# Patient Record
Sex: Female | Born: 1985 | Race: Black or African American | Hispanic: No | Marital: Married
Health system: Southern US, Community
[De-identification: ages and names within clinical notes are randomized; demographics above are authoritative.]

---

## 2004-08-31 ENCOUNTER — Emergency Department: Payer: Self-pay | Admitting: Emergency Medicine

## 2005-05-23 ENCOUNTER — Emergency Department: Payer: Self-pay | Admitting: Emergency Medicine

## 2007-02-02 ENCOUNTER — Emergency Department: Payer: Self-pay | Admitting: Emergency Medicine

## 2009-01-15 ENCOUNTER — Emergency Department: Payer: Self-pay | Admitting: Unknown Physician Specialty

## 2010-02-11 ENCOUNTER — Emergency Department: Payer: Self-pay | Admitting: Emergency Medicine

## 2012-02-13 ENCOUNTER — Ambulatory Visit: Payer: Self-pay | Admitting: Family Medicine

## 2012-02-16 LAB — BETA STREP CULTURE(ARMC)

## 2012-03-10 ENCOUNTER — Ambulatory Visit: Payer: Self-pay

## 2012-06-20 ENCOUNTER — Ambulatory Visit: Payer: Self-pay | Admitting: Internal Medicine

## 2012-08-06 ENCOUNTER — Ambulatory Visit: Payer: Self-pay | Admitting: Surgery

## 2012-08-13 ENCOUNTER — Ambulatory Visit: Payer: Self-pay | Admitting: Surgery

## 2012-08-13 LAB — CBC WITH DIFFERENTIAL/PLATELET
Basophil #: 0 10*3/uL (ref 0.0–0.1)
Basophil %: 0.4 %
Eosinophil #: 0.1 10*3/uL (ref 0.0–0.7)
Eosinophil %: 2.5 %
Lymphocyte #: 1.6 10*3/uL (ref 1.0–3.6)
Lymphocyte %: 30.6 %
MCH: 28.8 pg (ref 26.0–34.0)
MCV: 87 fL (ref 80–100)
Monocyte %: 4.5 %
Platelet: 339 10*3/uL (ref 150–440)
RBC: 4.8 10*6/uL (ref 3.80–5.20)
RDW: 13.7 % (ref 11.5–14.5)
WBC: 5.1 10*3/uL (ref 3.6–11.0)

## 2012-08-13 LAB — HEPATIC FUNCTION PANEL A (ARMC)
Bilirubin, Direct: <0.05 mg/dL (ref 0.00–0.20)
Total Protein: 8.1 g/dL (ref 6.4–8.2)

## 2012-08-26 ENCOUNTER — Ambulatory Visit: Payer: Self-pay | Admitting: Surgery

## 2013-07-22 ENCOUNTER — Emergency Department: Payer: Self-pay | Admitting: Internal Medicine

## 2013-07-27 ENCOUNTER — Emergency Department: Payer: Self-pay | Admitting: Emergency Medicine

## 2013-07-27 LAB — URINALYSIS, COMPLETE
BILIRUBIN, UR: NEGATIVE
BLOOD: NEGATIVE
Bacteria: NONE SEEN
Glucose,UR: NEGATIVE mg/dL (ref 0–75)
Ketone: NEGATIVE
Leukocyte Esterase: NEGATIVE
NITRITE: NEGATIVE
PH: 7 (ref 4.5–8.0)
Protein: NEGATIVE
RBC, UR: NONE SEEN /HPF (ref 0–5)
Specific Gravity: 1.019 (ref 1.003–1.030)
WBC UR: <1 /HPF (ref 0–5)

## 2014-06-25 NOTE — Op Note (Signed)
PATIENT NAME:  Krista Deleon, [name] MR#:  [id number] DATE OF BIRTH:  1986-02-17  DATE OF PROCEDURE:  08/06/2012  PREOPERATIVE DIAGNOSES: Cholecystitis and cholelithiasis.   POSTOPERATIVE DIAGNOSIS: Cholecystitis and cholelithiasis.   OPERATION: Robotically-assisted laparoscopic cholecystectomy.   SURGEON: Dr. Michela PitcherEly.   ANESTHESIA: General.   OPERATIVE PROCEDURE: With the patient in the supine position after the induction of appropriate general anesthesia, the patient's abdomen was prepped with ChloraPrep and draped with sterile towels. The patient was placed in the head-down, feet-up position. A small infraumbilical incision was made in the standard fashion, carried down bluntly through the subcutaneous tissue. A Veress needle was used to cannulate the peritoneal cavity. CO2 was insufflated to appropriate pressure measurements. When approximately 2.5 liters of CO2 were instilled the Veress needle was withdrawn. An 11 mm Ethicon bariatric port was inserted into the peritoneal cavity and intraperitoneal position was confirmed. CO2 was reinsufflated. Three 8.5 mm ports were placed in the standard fashion. Intraperitoneal position was confirmed. The robot was then brought to the table and docked to the ports without difficulty. Instruments were placed under direct vision and moved to the vicinity of the gallbladder, all under direct vision. The ports were locked in place. I then moved to the surgical console. The gallbladder was elevated and retracted superiorly and laterally. The hepatoduodenal ligament was looked at. The cystic duct and cystic artery were identified. Dissection was carried in the ligament without difficulty. The common duct was identified. The cystic duct was doubly-clipped and divided. The cystic artery was doubly-clipped and divided. The gallbladder was then dissected free from its bed in the liver  using the cauterized scissors. Dissection was without difficulty. However, a small rent was made in  the body of the gallbladder and that area was controlled with a clamp. Once the gallbladder was free the area was copiously irrigated with warm saline solution to dilute any bile that might have been spilled. No stones were lost. The robot was undocked, the camera moved to the lateral port. The gallbladder was captured in an EndoCatch apparatus, brought out through the umbilical port. The area was again copiously suctioned and irrigated. All ports were withdrawn under direct vision. The abdomen was desufflated. Skin incisions were closed with 5-0 nylon. Quarter-percent Marcaine was injected in the skin for postoperative pain control. Sterile dressings were applied. The patient was returned to the recovery room having tolerated the procedure well. Sponge, instrument, and needle counts were correct x 2 in the operating room.     ____________________________ Quentin Orealph L. Ely III, MD rle:dm D: 08/06/2012 09:50:54 ET T: 08/06/2012 10:25:32 ET JOB#: [id number]  cc: Carmie Endalph L. Ely III, MD, <Dictator> Bari EdwardLaura Berglund, MD Quentin OreALPH L ELY MD ELECTRONICALLY SIGNED 08/08/2012 8:22

## 2014-09-28 IMAGING — US US ABDOMEN LIMITED SLG ORGAN/ASCITES
1 series · 14 of 25 positions shown · non-contrast
Comparison: none

REASON FOR EXAM: gallbladder  check for bile leak  pt had gallbladder
removed
COMMENTS:

[Series 1: us abdomen limited slg organ/ascites · 0.31mm/px · 14 of 50 slices shown]
[im 1/50]
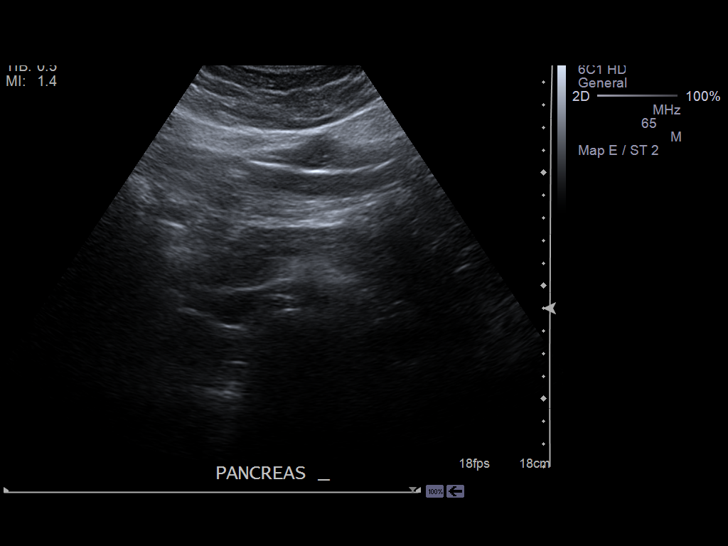
[im 5/50]
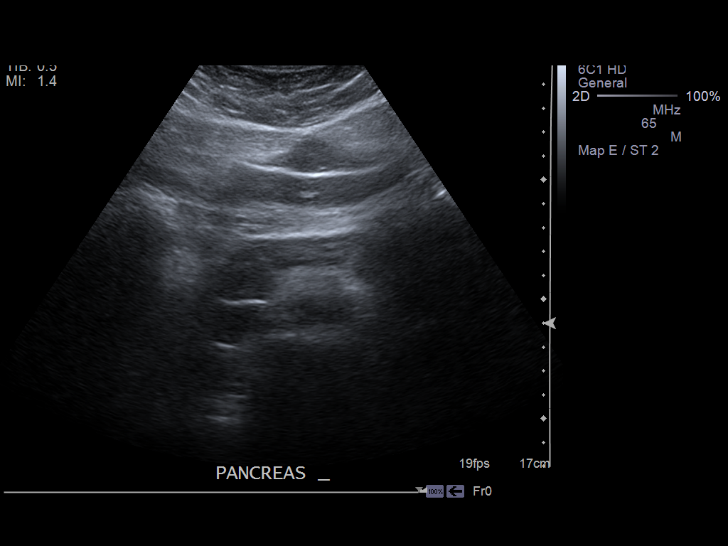
[im 9/50]
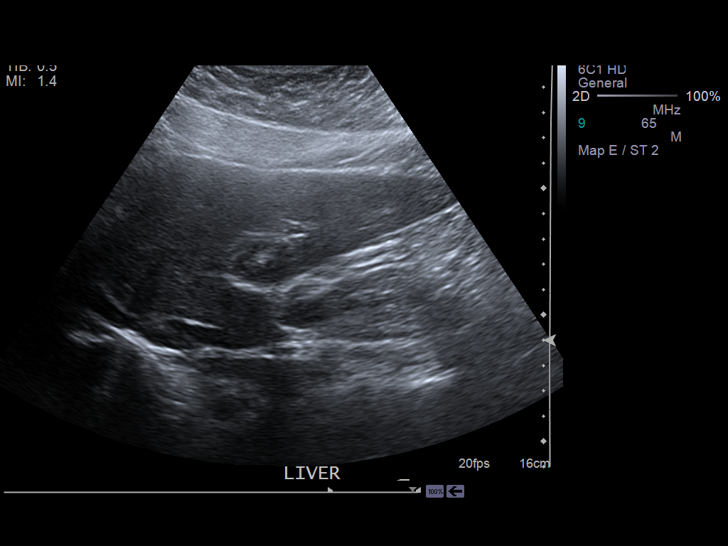
[im 13/50]
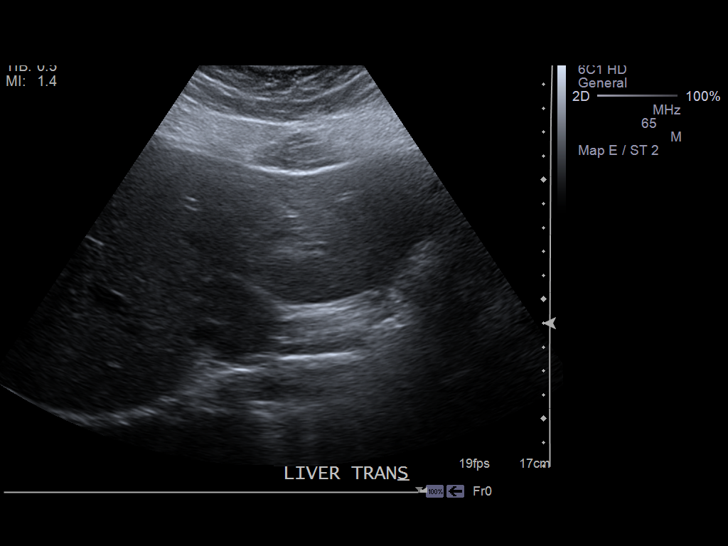
[im 17/50]
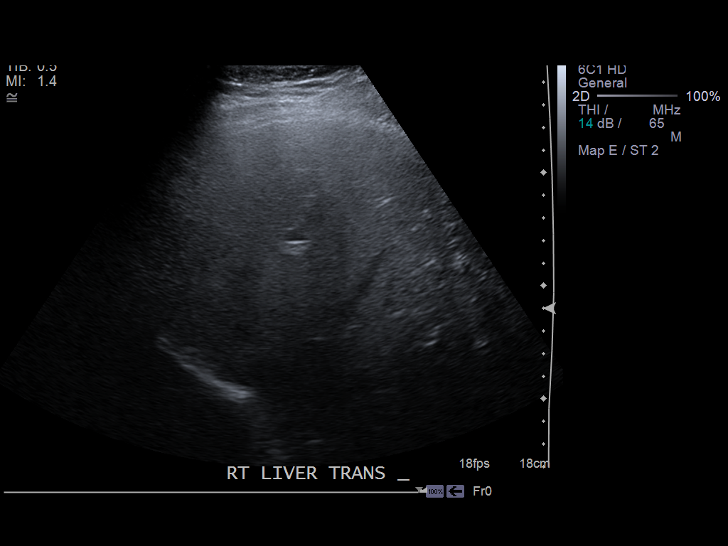
[im 19/50]
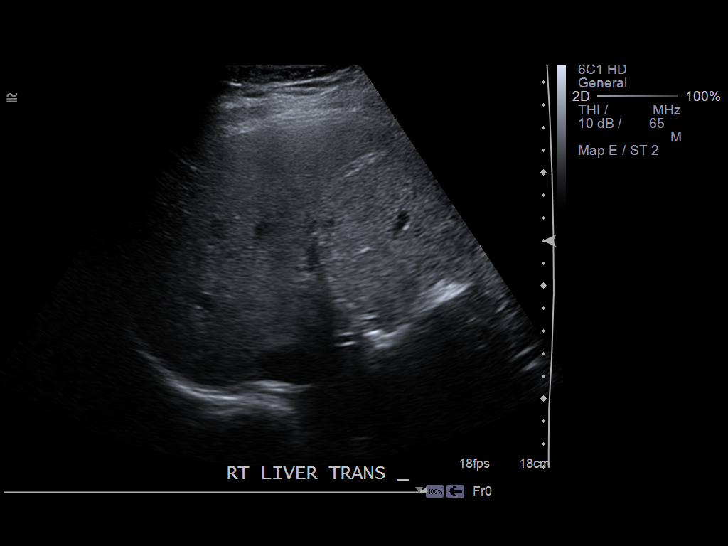
[im 23/50]
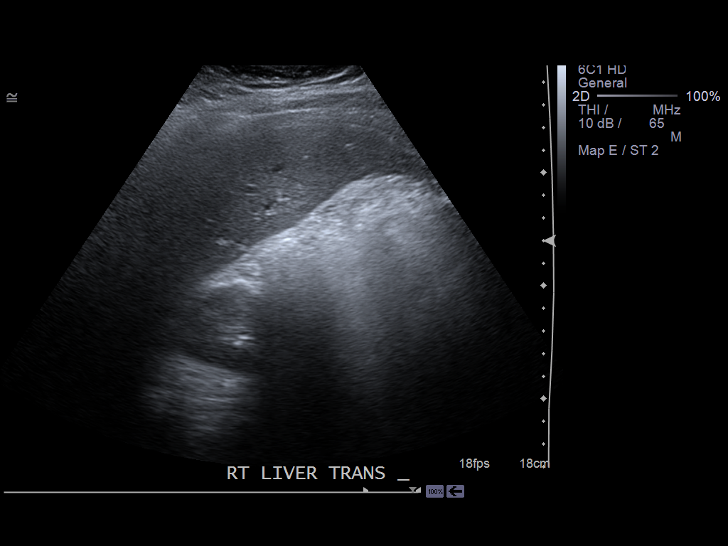
[im 27/50]
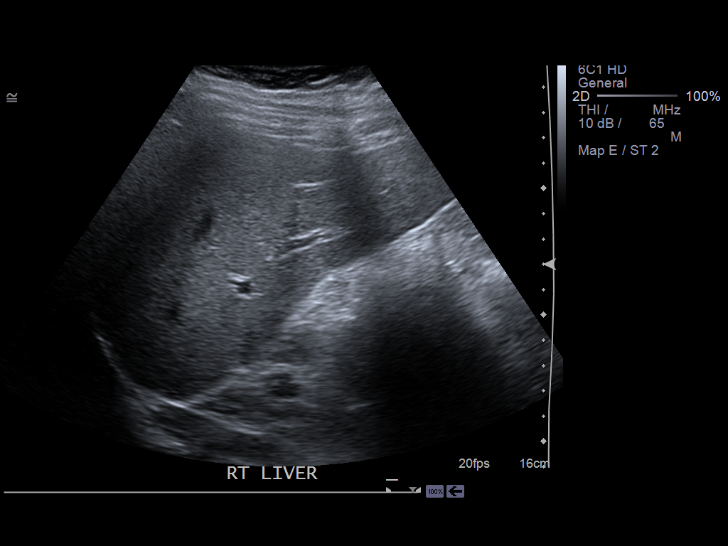
[im 31/50]
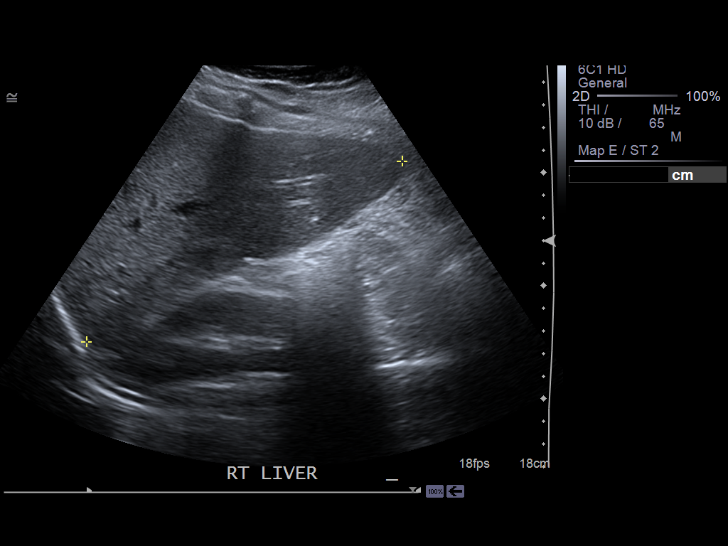
[im 33/50]
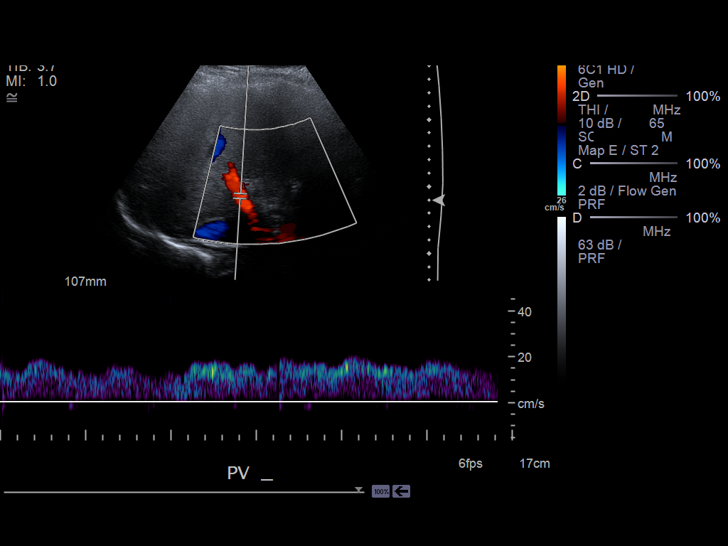
[im 37/50]
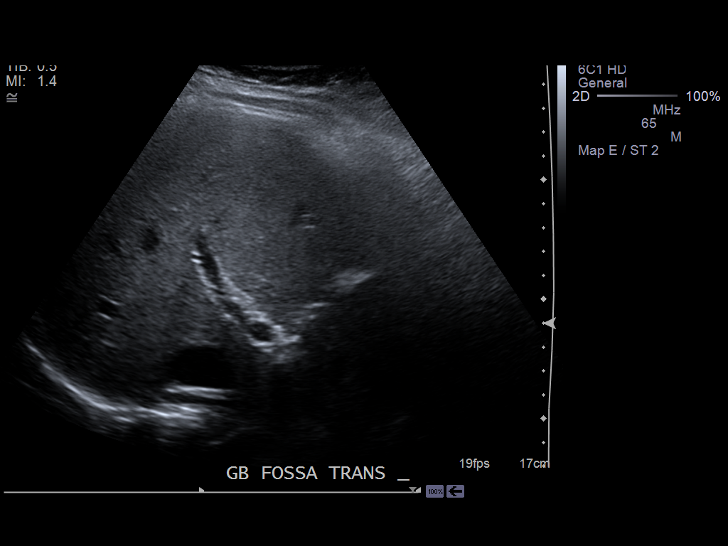
[im 41/50]
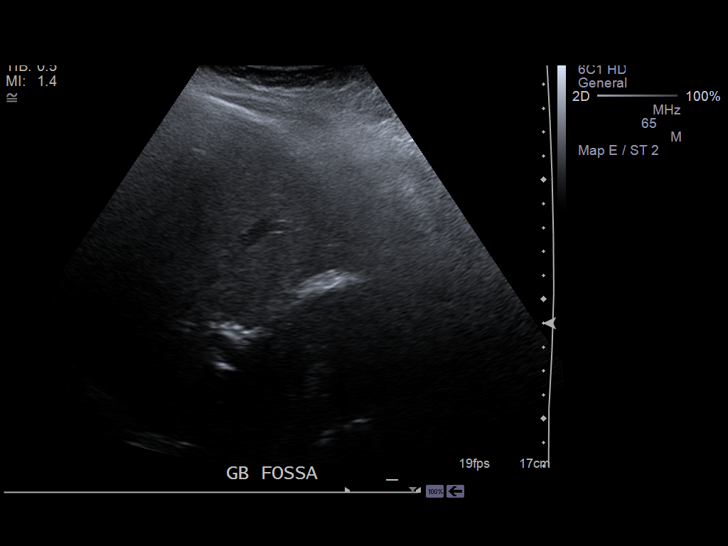
[im 45/50]
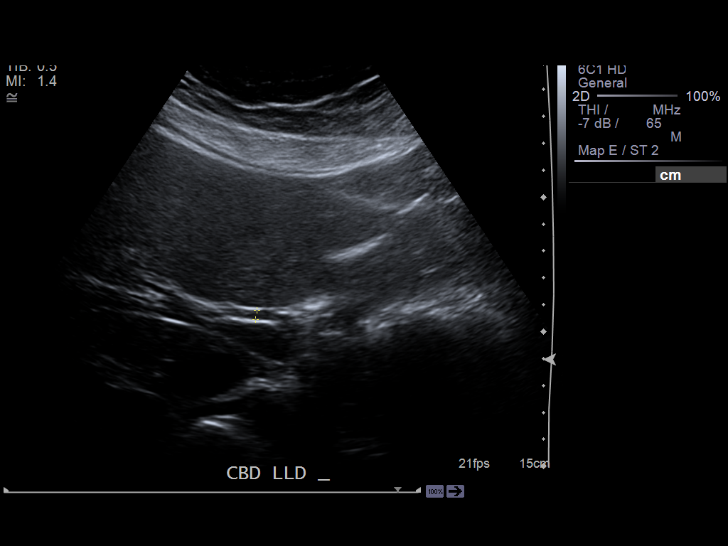
[im 50/50]
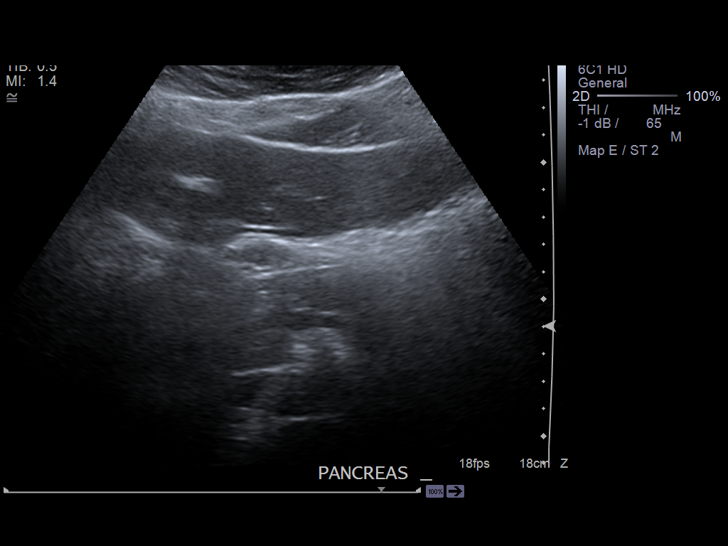

[14 of 25 positions shown; findings below may reference images not displayed]

PROCEDURE:     BLAIN - BLAIN ABDOMEN LTD 1 ORGAN OR QUAD  - August 26, 2012  [DATE]

RESULT:     Limited right upper quadrant abdominal sonogram is performed.
The visualized body the pancreas appears normal. The tail and head are not
well seen. The proximal inferior vena cava is patent. The hepatic
echotexture appears normal. The liver length is measured at 16.09 cm. Portal
venous flow is normal. The patient is status post cholecystectomy. The
common bile duct diameter is normal at 3.5 mm. No ascites is evident.
IMPRESSION: Incomplete visualization of the pancreas. The patient is
status post cholecystectomy. There is no subhepatic fluid collection
evident. The common bile duct appears normal. If there is continued concern
for bile leak followup with hepatobiliary scan would be recommended.

[REDACTED]
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
